# Patient Record
Sex: Female | Born: 1985 | Race: White | Hispanic: No | Marital: Single | Smoking: Former smoker
Health system: Southern US, Community
[De-identification: ages and names within clinical notes are randomized; demographics above are authoritative.]

## PROBLEM LIST (undated history)

## (undated) HISTORY — PX: HERNIA REPAIR: SHX51

---

## 2009-11-07 ENCOUNTER — Emergency Department (HOSPITAL_COMMUNITY): Admission: EM | Admit: 2009-11-07 | Discharge: 2009-11-07 | Payer: Self-pay | Admitting: Emergency Medicine

## 2011-01-31 ENCOUNTER — Emergency Department (HOSPITAL_COMMUNITY)
Admission: EM | Admit: 2011-01-31 | Discharge: 2011-02-01 | Disposition: A | Discharge status: Home / Self Care | Payer: Self-pay | Attending: Emergency Medicine | Admitting: Emergency Medicine

## 2011-01-31 DIAGNOSIS — IMO0002 Reserved for concepts with insufficient information to code with codable children: Secondary | ICD-10-CM | POA: Insufficient documentation

## 2011-01-31 DIAGNOSIS — T18108A Unspecified foreign body in esophagus causing other injury, initial encounter: Secondary | ICD-10-CM | POA: Insufficient documentation

## 2011-02-01 ENCOUNTER — Emergency Department (HOSPITAL_COMMUNITY): Payer: Self-pay

## 2012-05-18 ENCOUNTER — Emergency Department (HOSPITAL_COMMUNITY)
Admission: EM | Admit: 2012-05-18 | Discharge: 2012-05-18 | Disposition: A | Discharge status: Home / Self Care | Payer: Self-pay | Attending: Emergency Medicine | Admitting: Emergency Medicine

## 2012-05-18 ENCOUNTER — Emergency Department (HOSPITAL_COMMUNITY): Payer: Self-pay

## 2012-05-18 ENCOUNTER — Encounter (HOSPITAL_COMMUNITY): Payer: Self-pay | Admitting: *Deleted

## 2012-05-18 DIAGNOSIS — F17200 Nicotine dependence, unspecified, uncomplicated: Secondary | ICD-10-CM | POA: Insufficient documentation

## 2012-05-18 DIAGNOSIS — X500XXA Overexertion from strenuous movement or load, initial encounter: Secondary | ICD-10-CM | POA: Insufficient documentation

## 2012-05-18 DIAGNOSIS — Y998 Other external cause status: Secondary | ICD-10-CM | POA: Insufficient documentation

## 2012-05-18 DIAGNOSIS — Y929 Unspecified place or not applicable: Secondary | ICD-10-CM | POA: Insufficient documentation

## 2012-05-18 DIAGNOSIS — S335XXA Sprain of ligaments of lumbar spine, initial encounter: Secondary | ICD-10-CM | POA: Insufficient documentation

## 2012-05-18 DIAGNOSIS — Y939 Activity, unspecified: Secondary | ICD-10-CM | POA: Insufficient documentation

## 2012-05-18 DIAGNOSIS — S39012A Strain of muscle, fascia and tendon of lower back, initial encounter: Secondary | ICD-10-CM

## 2012-05-18 MED ORDER — HYDROMORPHONE HCL PF 2 MG/ML IJ SOLN
2.0000 mg | Freq: Once | INTRAMUSCULAR | Status: AC
  Administered 2012-05-18: 2 mg via INTRAMUSCULAR
  Filled 2012-05-18: qty 1

## 2012-05-18 MED ORDER — CYCLOBENZAPRINE HCL 10 MG PO TABS: 10.0000 mg | ORAL_TABLET | Freq: Two times a day (BID) | ORAL | Status: DC | PRN

## 2012-05-18 MED ORDER — HYDROCODONE-ACETAMINOPHEN 5-325 MG PO TABS: 2.0000 | ORAL_TABLET | ORAL | Status: DC | PRN

## 2012-05-18 MED ORDER — DICLOFENAC SODIUM 75 MG PO TBEC: 75.0000 mg | DELAYED_RELEASE_TABLET | Freq: Two times a day (BID) | ORAL | Status: DC

## 2012-05-18 MED ORDER — KETOROLAC TROMETHAMINE 60 MG/2ML IM SOLN
60.0000 mg | Freq: Once | INTRAMUSCULAR | Status: AC
  Administered 2012-05-18: 60 mg via INTRAMUSCULAR
  Filled 2012-05-18: qty 2

## 2012-05-18 NOTE — ED Notes (Signed)
Pt with lower back pain over the weekend since picking up a heavy box on Friday, today while at work, heard a pop sound while "adjusting herself on commode" , pt in tears in room

## 2012-05-18 NOTE — ED Provider Notes (Signed)
History     CSN: [id number]  Arrival date & time 05/18/12  1632   First MD Initiated Contact with Patient 05/18/12 1745      Chief Complaint  Patient presents with  . Back Pain    (Consider location/radiation/quality/duration/timing/severity/associated sxs/prior treatment) Patient is a 26 y.o. female presenting with back pain. The history is provided by the patient. No language interpreter was used.  Back Pain  This is a new problem. The current episode started more than 2 days ago. The problem occurs constantly. The problem has been rapidly worsening. The pain is associated with lifting heavy objects. The pain is present in the lumbar spine. The pain does not radiate. The pain is at a severity of 7/10. The pain is moderate. The pain is worse during the day. She has tried nothing for the symptoms.  Pt complains of pain in her low back.    History reviewed. No pertinent past medical history.  Past Surgical History  Procedure Date  . Hernia repair     History reviewed. No pertinent family history.  History  Substance Use Topics  . Smoking status: Current Every Day Smoker  . Smokeless tobacco: Not on file  . Alcohol Use:     OB History    Grav Para Term Preterm Abortions TAB SAB Ect Mult Living                  Review of Systems  Musculoskeletal: Positive for back pain.  All other systems reviewed and are negative.    Allergies  Other  Home Medications   Current Outpatient Rx  Name Route Sig Dispense Refill  . IBUPROFEN 200 MG PO TABS Oral Take 800 mg by mouth once as needed. For pain      BP 130/96  Pulse 109  Temp 97.6 F (36.4 C) (Oral)  Resp 22  Ht 5\' 4"  (1.626 m)  Wt 200 lb (90.719 kg)  BMI 34.33 kg/m2  SpO2 100%  LMP 05/11/2012  Physical Exam  Nursing note and vitals reviewed. Constitutional: She is oriented to person, place, and time. She appears well-developed and well-nourished.  HENT:  Head: Normocephalic and atraumatic.  Eyes: Pupils  are equal, round, and reactive to light.  Cardiovascular: Normal rate.   Pulmonary/Chest: Effort normal.  Abdominal: Soft.  Musculoskeletal: She exhibits tenderness.       Diffusely tender ls spine area  Pain with range of motion,  nv and ns intact  Neurological: She is alert and oriented to person, place, and time. She has normal reflexes.  Skin: Skin is warm.  Psychiatric: She has a normal mood and affect.    ED Course  Procedures (including critical care time)  Labs Reviewed - No data to display Dg Lumbar Spine Complete  05/18/2012  *RADIOLOGY REPORT*  Clinical Data: Back pain after after heavy lifting  LUMBAR SPINE - COMPLETE 4+ VIEW  Comparison: None.  Findings: Normal alignment of the lumbar vertebral bodies.  No loss of vertebral body height or disc height.  No subluxation.  No pars fracture.  IMPRESSION: No lumbar spine injury.   Original Report Authenticated By: Genevive Bi, M.D.      No diagnosis found.    MDM  Pt given torodol and dilaudid IM.    I will treat with flexeril and voltaren.   Pt advised to follow up with Dr. Romeo Apple for recheck        Elson Areas, [place] 05/18/12 [phone]

## 2012-05-18 NOTE — ED Notes (Addendum)
Low back pain for 2-3 days, worse today .  No known injury. Heard a pop when on commode today and  Became much worse

## 2012-05-19 NOTE — ED Provider Notes (Signed)
Medical screening examination/treatment/procedure(s) were performed by non-physician practitioner and as supervising physician I was immediately available for consultation/collaboration.  Jones Skene, M.D.     Jones Skene, MD 05/19/12 9211

## 2014-08-06 ENCOUNTER — Encounter (HOSPITAL_COMMUNITY): Payer: Self-pay | Admitting: Emergency Medicine

## 2014-08-06 ENCOUNTER — Emergency Department (HOSPITAL_COMMUNITY): Payer: Self-pay

## 2014-08-06 ENCOUNTER — Emergency Department (HOSPITAL_COMMUNITY)
Admission: EM | Admit: 2014-08-06 | Discharge: 2014-08-06 | Disposition: A | Discharge status: Home / Self Care | Payer: Self-pay | Attending: Emergency Medicine | Admitting: Emergency Medicine

## 2014-08-06 DIAGNOSIS — Z720 Tobacco use: Secondary | ICD-10-CM | POA: Insufficient documentation

## 2014-08-06 DIAGNOSIS — R059 Cough, unspecified: Secondary | ICD-10-CM

## 2014-08-06 DIAGNOSIS — J189 Pneumonia, unspecified organism: Secondary | ICD-10-CM

## 2014-08-06 DIAGNOSIS — R05 Cough: Secondary | ICD-10-CM

## 2014-08-06 DIAGNOSIS — Z791 Long term (current) use of non-steroidal anti-inflammatories (NSAID): Secondary | ICD-10-CM | POA: Insufficient documentation

## 2014-08-06 DIAGNOSIS — J159 Unspecified bacterial pneumonia: Secondary | ICD-10-CM | POA: Insufficient documentation

## 2014-08-06 MED ORDER — AZITHROMYCIN 250 MG PO TABS: 250.0000 mg | ORAL_TABLET | Freq: Every day | ORAL | Status: DC

## 2014-08-06 MED ORDER — BENZONATATE 100 MG PO CAPS: 100.0000 mg | ORAL_CAPSULE | Freq: Three times a day (TID) | ORAL | Status: DC

## 2014-08-06 NOTE — ED Provider Notes (Signed)
CSN: [id number]     Arrival date & time 08/06/14  0705 History  This chart was scribed for Hilario Quarryanielle S Tieasha Larsen, MD by Ronney LionSuzanne Le, ED Scribe. This patient was seen in room [facility]18[facility]18 and the patient's care was started at 8:22 AM.    Chief Complaint  Patient presents with  . Cough   The history is provided by the patient. No language interpreter was used.     HPI Comments: Catherine Acosta is a 29 y.o. female who presents to the Emergency Department complaining of a productive cough with yellow-white sputum that began 2 days ago. She complains of associated low-grade fever, up to 99.8. Patient has had sick contact as she has been caring for her mother, who has pneumonia. She is a 0.5 PPD smoker. She denies health problems. Patient denies loss of appetite. Dr. Prudy FeelerAngel Jones is her PCP. She denies pregnancy or known allergies to antibiotics.   History reviewed. No pertinent past medical history. Past Surgical History  Procedure Laterality Date  . Hernia repair     No family history on file. History  Substance Use Topics  . Smoking status: Current Every Day Smoker -- 0.50 packs/day    Types: Cigarettes  . Smokeless tobacco: Not on file  . Alcohol Use: No   OB History    No data available     Review of Systems  Constitutional: Positive for fever. Negative for appetite change.  Respiratory: Positive for cough.   All other systems reviewed and are negative.     Allergies  Other  Home Medications   Prior to Admission medications   Medication Sig Start Date End Date Taking? Authorizing Provider  cyclobenzaprine (FLEXERIL) 10 MG tablet Take 1 tablet (10 mg total) by mouth 2 (two) times daily as needed for muscle spasms. 05/18/12   Elson AreasLeslie K Sofia, PA-C  diclofenac (VOLTAREN) 75 MG EC tablet Take 1 tablet (75 mg total) by mouth 2 (two) times daily. 05/18/12   Elson AreasLeslie K Sofia, PA-C  HYDROcodone-acetaminophen (NORCO/VICODIN) 5-325 MG per tablet Take 2 tablets by mouth every 4 (four) hours  as needed for pain. 05/18/12   Elson AreasLeslie K Sofia, PA-C  ibuprofen (ADVIL,MOTRIN) 200 MG tablet Take 800 mg by mouth once as needed. For pain    Historical Provider, MD   BP 116/74 mmHg  Pulse 94  Temp(Src) 97.3 F (36.3 C) (Core (Comment))  Resp 18  Ht 5\' 5"  (1.651 m)  Wt 210 lb (95.255 kg)  BMI 34.95 kg/m2  SpO2 95%  LMP 07/26/2014 Physical Exam  Constitutional: She is oriented to person, place, and time. She appears well-developed and well-nourished. No distress.  HENT:  Head: Normocephalic and atraumatic.  Right Ear: External ear normal.  Left Ear: External ear normal.  Nose: Nose normal.  Eyes: Conjunctivae and EOM are normal. Pupils are equal, round, and reactive to light.  Neck: Normal range of motion. Neck supple.  Pulmonary/Chest: Effort normal.  Musculoskeletal: Normal range of motion.  Neurological: She is alert and oriented to person, place, and time. She exhibits normal muscle tone. Coordination normal.  Skin: Skin is warm and dry.  Psychiatric: She has a normal mood and affect. Her behavior is normal. Thought content normal.  Nursing note and vitals reviewed.   ED Course  Procedures (including critical care time)  DIAGNOSTIC STUDIES: Oxygen Saturation is 95% on room air, adequate by my interpretation.    COORDINATION OF CARE: 8:24 AM - Discussed treatment plan with pt at bedside which includes antibiotics  and pt agreed to plan.   Labs Review Labs Reviewed - No data to display  Imaging Review Dg Chest 2 View  08/06/2014   CLINICAL DATA:  Cough and fever for 2 days  EXAM: CHEST  2 VIEW  COMPARISON:  None.  FINDINGS: There is patchy infiltrate in the right middle lobe, better seen on the lateral view. Lungs elsewhere clear. Heart size and pulmonary vascularity are normal. No adenopathy. There is lower thoracic levoscoliosis.  IMPRESSION: Patchy infiltrate right middle lobe, better seen on lateral view.   Electronically Signed   By: Bretta Bang M.D.   On:  08/06/2014 08:10     EKG Interpretation None      MDM   Final diagnoses:  CAP (community acquired pneumonia)    I personally performed the services described in this documentation, which was scribed in my presence. The recorded information has been reviewed and considered.                   Hilario Quarry, MD 08/06/14 [phone]

## 2014-08-06 NOTE — ED Notes (Signed)
PT c/o nasal congestion, cough with productive yellow sputum and discomfort in her chest x2 days. PT reports mother at home with pneumonia recently. PT reports low grade fever at home and took tylenol prior to ED arrival.

## 2014-08-06 NOTE — Discharge Instructions (Signed)
Pneumonia, Adult  Pneumonia is an infection of the lungs. It may be caused by a germ (virus or bacteria). Some types of pneumonia can spread easily from person to person. This can happen when you cough or sneeze.  HOME CARE   Only take medicine as told by your doctor.   Take your medicine (antibiotics) as told. Finish it even if you start to feel better.   Do not smoke.   You may use a vaporizer or humidifier in your room. This can help loosen thick spit (mucus).   Sleep so you are almost sitting up (semi-upright). This helps reduce coughing.   Rest.  A shot (vaccine) can help prevent pneumonia. Shots are often advised for:   People over 65 years old.   Patients on chemotherapy.   People with long-term (chronic) lung problems.   People with immune system problems.  GET HELP RIGHT AWAY IF:    You are getting worse.   You cannot control your cough, and you are losing sleep.   You cough up blood.   Your pain gets worse, even with medicine.   You have a fever.   Any of your problems are getting worse, not better.   You have shortness of breath or chest pain.  MAKE SURE YOU:    Understand these instructions.   Will watch your condition.   Will get help right away if you are not doing well or get worse.  Document Released: 01/01/2008 Document Revised: 10/07/2011 Document Reviewed: 10/05/2010  ExitCare Patient Information 2015 [employer]. This information is not intended to replace advice given to you by your health care provider. Make sure you discuss any questions you have with your health care provider.

## 2015-05-09 ENCOUNTER — Emergency Department (HOSPITAL_COMMUNITY): Payer: Self-pay

## 2015-05-09 ENCOUNTER — Encounter (HOSPITAL_COMMUNITY): Payer: Self-pay | Admitting: Emergency Medicine

## 2015-05-09 ENCOUNTER — Emergency Department (HOSPITAL_COMMUNITY)
Admission: EM | Admit: 2015-05-09 | Discharge: 2015-05-09 | Disposition: A | Discharge status: Home / Self Care | Payer: Self-pay | Attending: Emergency Medicine | Admitting: Emergency Medicine

## 2015-05-09 DIAGNOSIS — S8991XA Unspecified injury of right lower leg, initial encounter: Secondary | ICD-10-CM | POA: Insufficient documentation

## 2015-05-09 DIAGNOSIS — W1849XA Other slipping, tripping and stumbling without falling, initial encounter: Secondary | ICD-10-CM | POA: Insufficient documentation

## 2015-05-09 DIAGNOSIS — Z720 Tobacco use: Secondary | ICD-10-CM | POA: Insufficient documentation

## 2015-05-09 DIAGNOSIS — Y998 Other external cause status: Secondary | ICD-10-CM | POA: Insufficient documentation

## 2015-05-09 DIAGNOSIS — Y9389 Activity, other specified: Secondary | ICD-10-CM | POA: Insufficient documentation

## 2015-05-09 DIAGNOSIS — Z792 Long term (current) use of antibiotics: Secondary | ICD-10-CM | POA: Insufficient documentation

## 2015-05-09 DIAGNOSIS — Z791 Long term (current) use of non-steroidal anti-inflammatories (NSAID): Secondary | ICD-10-CM | POA: Insufficient documentation

## 2015-05-09 DIAGNOSIS — M79604 Pain in right leg: Secondary | ICD-10-CM

## 2015-05-09 DIAGNOSIS — Y92007 Garden or yard of unspecified non-institutional (private) residence as the place of occurrence of the external cause: Secondary | ICD-10-CM | POA: Insufficient documentation

## 2015-05-09 NOTE — ED Notes (Signed)
Injured right lower leg hitting a stump in back yard.  Rates pain 6/10.

## 2015-05-09 NOTE — Discharge Instructions (Signed)

## 2015-05-09 NOTE — ED Provider Notes (Signed)
CSN: [id number]     Arrival date & time 05/09/15  1411 History   First MD Initiated Contact with Patient 05/09/15 1528     Chief Complaint  Patient presents with  . Leg Injury    right     (Consider location/radiation/quality/duration/timing/severity/associated sxs/prior Treatment) Patient is a 29 y.o. female presenting with leg pain. The history is provided by the patient. No language interpreter was used.  Leg Pain Location:  Leg Injury: no   Leg location:  R leg Pain details:    Quality:  Aching   Radiates to:  Does not radiate   Severity:  Moderate   Timing:  Constant   Progression:  Worsening Foreign body present:  No foreign bodies Relieved by:  Nothing Worsened by:  Nothing tried Associated symptoms: no decreased ROM   Pt tripped over a stump,  She did not fall.  Pt reports side of lower leg is sore today.  History reviewed. No pertinent past medical history. Past Surgical History  Procedure Laterality Date  . Hernia repair     History reviewed. No pertinent family history. Social History  Substance Use Topics  . Smoking status: Current Every Day Smoker -- 0.50 packs/day    Types: Cigarettes  . Smokeless tobacco: None  . Alcohol Use: No   OB History    No data available     Review of Systems  Musculoskeletal: Positive for myalgias.  All other systems reviewed and are negative.     Allergies  Other  Home Medications   Prior to Admission medications   Medication Sig Start Date End Date Taking? Authorizing Provider  azithromycin (ZITHROMAX Z-PAK) 250 MG tablet Take 1 tablet (250 mg total) by mouth daily. Take two on day one, then one on day 2-5 08/06/14   Margarita Grizzle, MD  benzonatate (TESSALON) 100 MG capsule Take 1 capsule (100 mg total) by mouth every 8 (eight) hours. 08/06/14   Margarita Grizzle, MD  cyclobenzaprine (FLEXERIL) 10 MG tablet Take 1 tablet (10 mg total) by mouth 2 (two) times daily as needed for muscle spasms. 05/18/12   Elson Areas, PA-C   diclofenac (VOLTAREN) 75 MG EC tablet Take 1 tablet (75 mg total) by mouth 2 (two) times daily. 05/18/12   Elson Areas, PA-C  HYDROcodone-acetaminophen (NORCO/VICODIN) 5-325 MG per tablet Take 2 tablets by mouth every 4 (four) hours as needed for pain. 05/18/12   Elson Areas, PA-C  ibuprofen (ADVIL,MOTRIN) 200 MG tablet Take 800 mg by mouth once as needed. For pain    Historical Provider, MD   BP 132/80 mmHg  Pulse 92  Temp(Src) 98 F (36.7 C) (Oral)  Resp 16  Ht  (1.676 m)  Wt 215 lb (97.523 kg)  BMI 34.72 kg/m2  SpO2 99%  LMP 04/25/2015 Physical Exam  Constitutional: She appears well-developed and well-nourished.  Musculoskeletal: She exhibits tenderness.  Tender right lower leg, no bruising, no deformity  nv and ns intact  Skin: Skin is warm.  Psychiatric: She has a normal mood and affect.  Nursing note and vitals reviewed.   ED Course  Procedures (including critical care time) Labs Review Labs Reviewed - No data to display  Imaging Review Dg Tibia/fibula Right  05/09/2015   CLINICAL DATA:  Fall last night.  Bruising and swelling  EXAM: RIGHT TIBIA AND FIBULA - 2 VIEW  COMPARISON:  None.  FINDINGS: There is no evidence of fracture or other focal bone lesions. Soft tissues are unremarkable.  IMPRESSION:  Negative.   Electronically Signed   By: Marlan Palau M.D.   On: 05/09/2015 14:50   I have personally reviewed and evaluated these images and lab results as part of my medical decision-making.   EKG Interpretation None      MDM  Soreness seems to be in muscle,  I suspect pulled/strained muscle.  Pt advised ice, elevate and ibuprofen   Final diagnoses:  Leg pain, right        Elson Areas, PA-C 05/09/15 1629  Donnetta Hutching, MD 05/11/15 0730

## 2016-07-22 ENCOUNTER — Encounter (HOSPITAL_COMMUNITY): Payer: Self-pay | Admitting: Emergency Medicine

## 2016-07-22 ENCOUNTER — Emergency Department (HOSPITAL_COMMUNITY): Payer: Self-pay

## 2016-07-22 ENCOUNTER — Inpatient Hospital Stay (HOSPITAL_COMMUNITY)
Admission: EM | Admit: 2016-07-22 | Discharge: 2016-07-23 | DRG: 158 | Discharge status: Against Medical Advice | Payer: Self-pay | Attending: Internal Medicine | Admitting: Internal Medicine

## 2016-07-22 DIAGNOSIS — R112 Nausea with vomiting, unspecified: Secondary | ICD-10-CM | POA: Diagnosis present

## 2016-07-22 DIAGNOSIS — L03211 Cellulitis of face: Secondary | ICD-10-CM | POA: Diagnosis present

## 2016-07-22 DIAGNOSIS — Z87891 Personal history of nicotine dependence: Secondary | ICD-10-CM

## 2016-07-22 DIAGNOSIS — Z881 Allergy status to other antibiotic agents status: Secondary | ICD-10-CM

## 2016-07-22 DIAGNOSIS — Z803 Family history of malignant neoplasm of breast: Secondary | ICD-10-CM

## 2016-07-22 DIAGNOSIS — K047 Periapical abscess without sinus: Principal | ICD-10-CM | POA: Diagnosis present

## 2016-07-22 LAB — CBC WITH DIFFERENTIAL/PLATELET
Basophils Absolute: 0 10*3/uL (ref 0.0–0.1)
Basophils Relative: 0 %
EOS ABS: 0.1 10*3/uL (ref 0.0–0.7)
EOS PCT: 1 %
HCT: 39.2 % (ref 36.0–46.0)
Hemoglobin: 13.6 g/dL (ref 12.0–15.0)
LYMPHS ABS: 3.3 10*3/uL (ref 0.7–4.0)
LYMPHS PCT: 29 %
MCH: 31.4 pg (ref 26.0–34.0)
MCHC: 34.7 g/dL (ref 30.0–36.0)
MCV: 90.5 fL (ref 78.0–100.0)
MONO ABS: 0.9 10*3/uL (ref 0.1–1.0)
MONOS PCT: 7 %
Neutro Abs: 7.3 10*3/uL (ref 1.7–7.7)
Neutrophils Relative %: 63 %
PLATELETS: 319 10*3/uL (ref 150–400)
RBC: 4.33 MIL/uL (ref 3.87–5.11)
RDW: 12.1 % (ref 11.5–15.5)
WBC: 11.6 10*3/uL — ABNORMAL HIGH (ref 4.0–10.5)

## 2016-07-22 LAB — BASIC METABOLIC PANEL
Anion gap: 6 (ref 5–15)
BUN: 9 mg/dL (ref 6–20)
CO2: 25 mmol/L (ref 22–32)
CREATININE: 0.6 mg/dL (ref 0.44–1.00)
Calcium: 9.3 mg/dL (ref 8.9–10.3)
Chloride: 105 mmol/L (ref 101–111)
GFR calc Af Amer: >60 mL/min (ref >60)
GLUCOSE: 112 mg/dL — AB (ref 65–99)
POTASSIUM: 3.7 mmol/L (ref 3.5–5.1)
SODIUM: 136 mmol/L (ref 135–145)

## 2016-07-22 MED ORDER — SODIUM CHLORIDE 0.9 % IV SOLN
1000.0000 mL | Freq: Once | INTRAVENOUS | Status: AC
  Administered 2016-07-22: 1000 mL via INTRAVENOUS

## 2016-07-22 MED ORDER — SODIUM CHLORIDE 0.9 % IV SOLN
1000.0000 mL | INTRAVENOUS | Status: DC
  Administered 2016-07-22: 1000 mL via INTRAVENOUS

## 2016-07-22 MED ORDER — ONDANSETRON HCL 4 MG/2ML IJ SOLN
4.0000 mg | Freq: Once | INTRAMUSCULAR | Status: AC
  Administered 2016-07-22: 4 mg via INTRAVENOUS
  Filled 2016-07-22: qty 2

## 2016-07-22 MED ORDER — CLINDAMYCIN PHOSPHATE 900 MG/50ML IV SOLN
900.0000 mg | Freq: Once | INTRAVENOUS | Status: AC
  Administered 2016-07-22: 900 mg via INTRAVENOUS
  Filled 2016-07-22: qty 50

## 2016-07-22 MED ORDER — KETOROLAC TROMETHAMINE 30 MG/ML IJ SOLN
30.0000 mg | Freq: Once | INTRAMUSCULAR | Status: AC
  Administered 2016-07-22: 30 mg via INTRAVENOUS
  Filled 2016-07-22: qty 1

## 2016-07-22 MED ORDER — IOPAMIDOL (ISOVUE-300) INJECTION 61%
75.0000 mL | Freq: Once | INTRAVENOUS | Status: AC | PRN
  Administered 2016-07-22: 75 mL via INTRAVENOUS

## 2016-07-22 MED ORDER — HYDROMORPHONE HCL 1 MG/ML IJ SOLN
1.0000 mg | Freq: Once | INTRAMUSCULAR | Status: AC
  Administered 2016-07-22: 1 mg via INTRAVENOUS
  Filled 2016-07-22: qty 1

## 2016-07-22 NOTE — ED Provider Notes (Signed)
[facility]-EMERGENCY DEPT Provider Note   CSN: [id number] Arrival date & time: 07/22/16  2145   By signing my name below, I, Bobbie Stackhristopher Reid, attest that this documentation has been prepared under the direction and in the presence of Azalia BilisKevin Darian Cansler, MD. Electronically Signed: Bobbie Stackhristopher Reid, Scribe. 07/22/16. 10:30 PM. History   Chief Complaint Chief Complaint  Patient presents with  . Facial Swelling     The history is provided by the patient and a friend. No language interpreter was used.    HPI Comments: Catherine Acosta is a 30 y.o. female who presents to the Emergency Department complaining of right sided facial swelling since yesterday. Per triage note: Patient has had a right dental abscess for 2 days. The patient reports that the swelling didn't begin until yesterday. She states that [facility] in which they gave her Deoxycycline. She states that this antibiotic caused her to vomit. Patient reports no alleviating factors.  History reviewed. No pertinent past medical history.  There are no active problems to display for this patient.   Past Surgical History:  Procedure Laterality Date  . HERNIA REPAIR      OB History    Gravida Para Term Preterm AB Living             0   SAB TAB Ectopic Multiple Live Births                   Home Medications    Prior to Admission medications   Medication Sig Start Date End Date Taking? Authorizing Provider  doxycycline (ADOXA) 100 MG tablet Take 100 mg by mouth 2 (two) times daily. 10 day course starting on 07/21/2016 07/21/16  Yes Historical Provider, MD  Echinacea 125 MG CAPS Take 1 capsule by mouth 2 (two) times daily.   Yes Historical Provider, MD  traMADol (ULTRAM) 50 MG tablet Take 50 mg by mouth every 4 (four) hours as needed. For pain 07/21/16  Yes Historical Provider, MD  vitamin C (ASCORBIC ACID) 500 MG tablet Take 1,000 mg by mouth daily.   Yes Historical Provider, MD    Family  History History reviewed. No pertinent family history.  Social History Social History  Substance Use Topics  . Smoking status: Former Smoker    Packs/day: 0.50    Types: Cigarettes  . Smokeless tobacco: Never Used  . Alcohol use No     Allergies   Keflex [cephalexin] and Other   Review of Systems Review of Systems  All other systems reviewed and are negative.  A complete 10 system review of systems was obtained and all systems are negative except as noted in the HPI and PMH.   Physical Exam Updated Vital Signs BP 135/98 (BP Location: Left Arm)   Pulse 73   Temp 97.8 F (36.6 C) (Temporal)   Resp 20   Ht 5\' 4"  (1.626 m)   Wt 210 lb (95.3 kg)   LMP 07/10/2016   SpO2 100%   BMI 36.05 kg/m   Physical Exam  Constitutional: She is oriented to person, place, and time. She appears well-developed and well-nourished.  HENT:  Significant right sided facial swelling. Tenderness of right lower first molar without gingival swelling or fluctuance. Erythema of submental space without significant swelling. No anterior neck erythema or chest erythema. Tolerating secretions. Mild trismus.   Eyes: EOM are normal.  Neck: Normal range of motion.  Pulmonary/Chest: Effort normal.  Abdominal: She exhibits no distension.  Musculoskeletal: Normal range  of motion.  Neurological: She is alert and oriented to person, place, and time.  Psychiatric: She has a normal mood and affect.  Nursing note and vitals reviewed.    ED Treatments / Results  DIAGNOSTIC STUDIES: Oxygen Saturation is 100% on RA, normal by my interpretation.    COORDINATION OF CARE: 10:30 PM Discussed treatment plan with pt at bedside and pt agreed to plan.  Labs (all labs ordered are listed, but only abnormal results are displayed) Labs Reviewed - No data to display  EKG  EKG Interpretation None       Radiology No results found.  Procedures Procedures (including critical care time)  Medications Ordered  in ED Medications - No data to display   Initial Impression / Assessment and Plan / ED Course  I have reviewed the triage vital signs and the nursing notes.  Pertinent labs & imaging results that were available during my care of the patient were reviewed by me and considered in my medical decision making (see chart for details).  Clinical Course     Patient started on IV clindamycin.  Pain treated.  Significant swelling of right side of her face.  CT imaging pending for likely odontogenic source of infection.  11:16 PM Care to Dr Lynelle DoctorKnapp  Final Clinical Impressions(s) / ED Diagnoses   Final diagnoses:  None    New Prescriptions New Prescriptions   No medications on file   I personally performed the services described in this documentation, which was scribed in my presence. The recorded information has been reviewed and is accurate.       Azalia BilisKevin Anastashia Westerfeld, MD 07/22/16 2322

## 2016-07-22 NOTE — ED Triage Notes (Signed)
Pt reports R dental abscess that started 2 days ago. [facility] and given Doxycycline and Tramadol. Pt states the doxycycline is making her vomit and she has developed R sided facial edema.

## 2016-07-23 ENCOUNTER — Encounter (HOSPITAL_COMMUNITY): Payer: Self-pay | Admitting: Internal Medicine

## 2016-07-23 DIAGNOSIS — L03211 Cellulitis of face: Secondary | ICD-10-CM | POA: Diagnosis present

## 2016-07-23 DIAGNOSIS — R112 Nausea with vomiting, unspecified: Secondary | ICD-10-CM | POA: Diagnosis present

## 2016-07-23 LAB — HEPATIC FUNCTION PANEL
ALK PHOS: 39 U/L (ref 38–126)
ALT: 17 U/L (ref 14–54)
AST: 19 U/L (ref 15–41)
Albumin: 4 g/dL (ref 3.5–5.0)
Total Bilirubin: 0.2 mg/dL — ABNORMAL LOW (ref 0.3–1.2)
Total Protein: 7 g/dL (ref 6.5–8.1)

## 2016-07-23 LAB — MRSA PCR SCREENING: MRSA by PCR: NEGATIVE

## 2016-07-23 MED ORDER — CLINDAMYCIN PHOSPHATE 900 MG/50ML IV SOLN
INTRAVENOUS | Status: AC
  Filled 2016-07-23: qty 50

## 2016-07-23 MED ORDER — POTASSIUM CHLORIDE IN NACL 20-0.9 MEQ/L-% IV SOLN
INTRAVENOUS | Status: DC
  Administered 2016-07-23: 02:00:00 via INTRAVENOUS

## 2016-07-23 MED ORDER — HYDROMORPHONE HCL 1 MG/ML IJ SOLN: 1.0000 mg | INTRAMUSCULAR | Status: DC | PRN

## 2016-07-23 MED ORDER — PANTOPRAZOLE SODIUM 40 MG PO TBEC
40.0000 mg | DELAYED_RELEASE_TABLET | Freq: Every day | ORAL | Status: DC
  Administered 2016-07-23: 40 mg via ORAL
  Filled 2016-07-23 (×2): qty 1

## 2016-07-23 MED ORDER — ONDANSETRON HCL 4 MG/2ML IJ SOLN: 4.0000 mg | Freq: Four times a day (QID) | INTRAMUSCULAR | Status: DC | PRN

## 2016-07-23 MED ORDER — CLINDAMYCIN PHOSPHATE 900 MG/50ML IV SOLN
900.0000 mg | Freq: Three times a day (TID) | INTRAVENOUS | Status: DC
  Administered 2016-07-23 (×3): 900 mg via INTRAVENOUS
  Filled 2016-07-23 (×6): qty 50

## 2016-07-23 MED ORDER — LORAZEPAM 0.5 MG PO TABS: 0.5000 mg | ORAL_TABLET | Freq: Once | ORAL | Status: DC | PRN

## 2016-07-23 MED ORDER — ENOXAPARIN SODIUM 40 MG/0.4ML ~~LOC~~ SOLN
40.0000 mg | SUBCUTANEOUS | Status: DC
  Filled 2016-07-23: qty 0.4

## 2016-07-23 MED ORDER — HYDROMORPHONE HCL 1 MG/ML IJ SOLN
1.0000 mg | INTRAMUSCULAR | Status: AC | PRN
  Administered 2016-07-23 (×2): 1 mg via INTRAVENOUS
  Filled 2016-07-23 (×2): qty 1

## 2016-07-23 MED ORDER — HYDROCODONE-ACETAMINOPHEN 5-325 MG PO TABS
1.0000 | ORAL_TABLET | Freq: Four times a day (QID) | ORAL | Status: DC | PRN
  Administered 2016-07-23 (×3): 2 via ORAL
  Filled 2016-07-23 (×3): qty 2

## 2016-07-23 MED ORDER — KETOROLAC TROMETHAMINE 30 MG/ML IJ SOLN
30.0000 mg | Freq: Four times a day (QID) | INTRAMUSCULAR | Status: DC
  Administered 2016-07-23 (×3): 30 mg via INTRAVENOUS
  Filled 2016-07-23 (×3): qty 1

## 2016-07-23 NOTE — Progress Notes (Signed)
RN, Misty StanleyLisa, paged this NP for pain med for pt and to inform NP that pt was talking about leaving hospital to go to another tx facility.  NP spoke with Misty StanleyLisa.  Per Misty StanleyLisa, RN, pt is upset about her face and the fact that the doctor only spent a couple of minutes with her today and she felt like no one cared.   NP reordered some Dilaudid IV. Pt has Hydrocodone and Toradol ordered as well. NP ordered Ativan once prn for rest/nerves and a kpad for face.  NP asked Misty StanleyLisa, RN, to call NP back so NP could speak with pt over the phone.  Later, Misty StanleyLisa, RN, called NP to state pt had already left hospital, refused to speak with NP on the telephone, and refused to sign AMA papers. Misty StanleyLisa stated she spent a lot of time talking with pt tonight but pt wouldn't stay.  KJKG, NP [facility]

## 2016-07-23 NOTE — Progress Notes (Signed)
Patient was admitted earlier this morning by Dr. Robb Matarrtiz.  Patient seen and examined. She continues to complain of pain in her right cheek and has difficulty eating food on that side. No draining lesions. She is swollen and tender to touch on right cheek. She has been started on clindamycin. She does not have any drainable fluid collections on CT imaging. Will need to continue with IV antibiotics for now.  MEMON,JEHANZEB

## 2016-07-23 NOTE — ED Provider Notes (Signed)
Pt left at change of shift to get results of CT soft tissue neck. [facility] and started on doxycycline but she vomited it within minutes of taking it.    Pt noted to have large swelling of her right face, her right anterior neck with redness and warmth.      Ct Soft Tissue Neck W Contrast  Result Date: 07/23/2016 CLINICAL DATA:  Right facial swelling EXAM: CT NECK WITH CONTRAST TECHNIQUE: Multidetector CT imaging of the neck was performed using the standard protocol following the bolus administration of intravenous contrast. CONTRAST:  75mL ISOVUE-300 IOPAMIDOL (ISOVUE-300) INJECTION 61% COMPARISON:  None. FINDINGS: Pharynx and larynx: The nasopharynx is clear. The oropharynx and hypopharynx are normal. The epiglottis, supraglottic larynx, glottis and subglottic larynx are normal. No retropharyngeal collection. The parapharyngeal spaces are preserved. Salivary glands: The parotid and submandibular glands are normal. No sialolithiasis or salivary ductal dilatation. Thyroid: Normal Lymph nodes: Enlarged right level 1 B lymph nodes measure up to 7 mm. Vascular: The major cervical vessels are normal. Limited intracranial: Normal Visualized orbits: Normal Mastoids and visualized paranasal sinuses: Clear Skeleton: Normal Upper chest: Clear Other: There is severe soft tissue swelling of the right face overlying the right mandibular body. There is inflammatory stranding within the subcutaneous fat of the right face extending inferiorly below the mandible. There is no discrete fluid collection. There is no clear source identified. IMPRESSION: 1. Soft tissue swelling and inflammation of the right face overlying the right mandibular body without a clear dental source. No drainable fluid collection. 2. Reactive right level 1 lymphadenopathy. Electronically Signed   By: Deatra RobinsonKevin  Herman M.D.   On: 07/23/2016 00:23   12:43 AM Dr Robb Matarrtiz will admit and do orders.  12:45 AM pt informed  of her CT results and that she would be admitted here.  Diagnoses that have been ruled out:  None  Diagnoses that are still under consideration:  None  Final diagnoses:  Facial cellulitis   Plan admission  Devoria AlbeIva Tramar Brueckner, MD, Concha PyoFACEP       Carmyn Hamm, MD 07/23/16 [phone]

## 2016-07-23 NOTE — H&P (Signed)
History and Physical    HAYDON KALMAR KGM:[id number] DOB: 1986-03-14 DOA: 07/22/2016  PCP: Remus Loffler, PA-C   Patient coming from: Home.  Chief Complaint: Dental abscess.  HPI: Catherine Acosta is a 30 y.o. female without any significant medical history who is coming to the emergency department for evaluation of a right "dental abscess".   Per patient, she started having pain about 2 days ago. Yesterday evening, and she went to [facility] and was given doxycycline and tramadol. However, when she tried taking the doxycycline on 2 different times, she states that he made her vomit. She comes tonight to the hospital due to development of marked swelling of her right facial area today and worsening of pain. She denies trauma to the area, fever, chills, night sweats, but feels a little fatigued. Initially, she rated her facial pain at around 10, but states that after the medications were given the pain has decreased by at least 50%. She denies headache, dysphagia, dyspnea, cough, chest pain, palpitations, dizziness, diaphoresis or lower extremity edema. She denies abdominal pain or nausea at this time. She denies diarrhea, constipation, melena or hematochezia. She denies GU symptoms.  ED Course: The patient received a 1 L normal saline bolus, hydromorphone 1 mg, ketorolac 30 mg, Zofran 4 mg, and clindamycin 900 mg IVPB. Her workup shows WBC of 11.6, hemoglobin 13.6, platelets 319. Her sodium was 136, potassium 3.7, chloride 105 and CO2 was 25 mmol/L. Her BUN was 9, creatinine 0.6 and glucose 112 mg/dL.  Imaging: CT of the neck with contrast shows soft tissue swelling of the right face overlying the right mandibular body with reactive adenopathy.  Review of Systems: As per HPI otherwise 10 point review of systems negative.    History reviewed. No pertinent past medical history.  Past Surgical History:  Procedure Laterality Date  . HERNIA REPAIR       reports that she has  quit smoking. Her smoking use included Cigarettes. She smoked 0.50 packs per day. She has never used smokeless tobacco. She reports that she does not drink alcohol or use drugs.  Allergies  Allergen Reactions  . Keflex [Cephalexin] Other (See Comments)    Reaction to skin "almost like a burn"  . Other Swelling    Fish oil pills: facial and body swelling, redness   Family history  Paternal grandmother- Breast cancer   Prior to Admission medications   Medication Sig Start Date End Date Taking? Authorizing Provider  doxycycline (ADOXA) 100 MG tablet Take 100 mg by mouth 2 (two) times daily. 10 day course starting on 07/21/2016 07/21/16  Yes Historical Provider, MD  Echinacea 125 MG CAPS Take 1 capsule by mouth 2 (two) times daily.   Yes Historical Provider, MD  traMADol (ULTRAM) 50 MG tablet Take 50 mg by mouth every 4 (four) hours as needed. For pain 07/21/16  Yes Historical Provider, MD  vitamin C (ASCORBIC ACID) 500 MG tablet Take 1,000 mg by mouth daily.   Yes Historical Provider, MD    Physical Exam:  Constitutional: NAD, calm, comfortable Vitals:   07/22/16 2150  BP: 135/98  Pulse: 73  Resp: 20  Temp: 97.8 F (36.6 C)  TempSrc: Temporal  SpO2: 100%  Weight: 95.3 kg (210 lb)  Height: 5\' 4"  (1.626 m)   Eyes: PERRL, lids and conjunctivae normal ENMT: Mucous membranes are moist. Posterior pharynx clear of any exudate or lesions. Right sided lower molars with metal fillings. Neck: normal, supple, no masses, no thyromegaly Respiratory:  clear to auscultation bilaterally, no wheezing, no crackles. Normal respiratory effort. No accessory muscle use.  Cardiovascular: Regular rate and rhythm, no murmurs / rubs / gallops. No extremity edema. 2+ pedal pulses. No carotid bruits.  Abdomen: no tenderness, no masses palpated. No hepatosplenomegaly. Bowel sounds positive.  Musculoskeletal: no clubbing / cyanosis. No joint deformity upper and lower extremities. Good ROM, no contractures.  Normal muscle tone.  Skin: Positive edema, erythema, calor and tenderness of the right side of face area. Neurologic: CN 2-12 grossly intact. Sensation intact, DTR normal. Strength 5/5 in all 4.  Psychiatric: Normal judgment and insight. Alert and oriented x 3. Normal mood.    Labs on Admission: I have personally reviewed following labs and imaging studies  CBC:  Recent Labs Lab 07/22/16 2255  WBC 11.6*  NEUTROABS 7.3  HGB 13.6  HCT 39.2  MCV 90.5  PLT 319   Basic Metabolic Panel:  Recent Labs Lab 07/22/16 2255  NA 136  K 3.7  CL 105  CO2 25  GLUCOSE 112*  BUN 9  CREATININE 0.60  CALCIUM 9.3   GFR: Estimated Creatinine Clearance: 115.1 mL/min (by C-G formula based on SCr of 0.6 mg/dL). Liver Function Tests: No results for input(s): AST, ALT, ALKPHOS, BILITOT, PROT, ALBUMIN in the last 168 hours. No results for input(s): LIPASE, AMYLASE in the last 168 hours. No results for input(s): AMMONIA in the last 168 hours. Coagulation Profile: No results for input(s): INR, PROTIME in the last 168 hours. Cardiac Enzymes: No results for input(s): CKTOTAL, CKMB, CKMBINDEX, TROPONINI in the last 168 hours. BNP (last 3 results) No results for input(s): PROBNP in the last 8760 hours. HbA1C: No results for input(s): HGBA1C in the last 72 hours. CBG: No results for input(s): GLUCAP in the last 168 hours. Lipid Profile: No results for input(s): CHOL, HDL, LDLCALC, TRIG, CHOLHDL, LDLDIRECT in the last 72 hours. Thyroid Function Tests: No results for input(s): TSH, T4TOTAL, FREET4, T3FREE, THYROIDAB in the last 72 hours. Anemia Panel: No results for input(s): VITAMINB12, FOLATE, FERRITIN, TIBC, IRON, RETICCTPCT in the last 72 hours. Urine analysis: No results found for: COLORURINE, APPEARANCEUR, LABSPEC, PHURINE, GLUCOSEU, HGBUR, BILIRUBINUR, KETONESUR, PROTEINUR, UROBILINOGEN, NITRITE, LEUKOCYTESUR  Radiological Exams on Admission: Ct Soft Tissue Neck W Contrast  Result  Date: 07/23/2016 CLINICAL DATA:  Right facial swelling EXAM: CT NECK WITH CONTRAST TECHNIQUE: Multidetector CT imaging of the neck was performed using the standard protocol following the bolus administration of intravenous contrast. CONTRAST:  75mL ISOVUE-300 IOPAMIDOL (ISOVUE-300) INJECTION 61% COMPARISON:  None. FINDINGS: Pharynx and larynx: The nasopharynx is clear. The oropharynx and hypopharynx are normal. The epiglottis, supraglottic larynx, glottis and subglottic larynx are normal. No retropharyngeal collection. The parapharyngeal spaces are preserved. Salivary glands: The parotid and submandibular glands are normal. No sialolithiasis or salivary ductal dilatation. Thyroid: Normal Lymph nodes: Enlarged right level 1 B lymph nodes measure up to 7 mm. Vascular: The major cervical vessels are normal. Limited intracranial: Normal Visualized orbits: Normal Mastoids and visualized paranasal sinuses: Clear Skeleton: Normal Upper chest: Clear Other: There is severe soft tissue swelling of the right face overlying the right mandibular body. There is inflammatory stranding within the subcutaneous fat of the right face extending inferiorly below the mandible. There is no discrete fluid collection. There is no clear source identified. IMPRESSION: 1. Soft tissue swelling and inflammation of the right face overlying the right mandibular body without a clear dental source. No drainable fluid collection. 2. Reactive right level 1 lymphadenopathy. Electronically Signed  By: Deatra RobinsonKevin  Herman M.D.   On: 07/23/2016 00:23     Assessment/Plan Principal Problem:   Facial cellulitis Admit to MedSurg his last inpatient. Mechanical soft diet. Continue clindamycin 900 mg IVPB every 6 hours. Hydromorphone 1 mg IVP every 3 hours 3 doses. Switch to oral analgesics once the patient edema decreases. Toradol 30 mg IVP every 6 hours 4 doses. Consult ENT if needed.  Active Problems:   Nausea and vomiting Normal saline plus  KCl 20 Meq at 125 mL an hour. Continue Zofran 4 mg IVP every 6 hours when necessary. Pantoprazole 40 mg by mouth daily.    DVT prophylaxis: Lovenox SQ.  Code Status: Full code.  Family Communication:  Disposition Plan: Admit for IV antibiotic therapy and pain control.  Consults called:  Admission status: Inpatient/MedSurg.   Bobette Moavid Manuel Chrissy Ealey MD [facility] Pager [phone].  If 7PM-7AM, please contact night-coverage [email or web address] Password [email or web address]  07/23/2016, 1:08 AM

## 2016-07-23 NOTE — ED Notes (Signed)
[facility], all questions answered

## 2016-07-23 NOTE — Care Management Note (Signed)
Case Management Note  Patient Details  Name: Catherine Acosta MRN: [id number] Date of Birth: 02-Feb-1986  Subjective/Objective:     Patient adm from home with dental abscess/facial cellulitis. She has failed OP treatment, reports vomiting antibiotic. She may need MATCH voucher at discharge. [facility] and has [facility].          Action/Plan: Will follow for CM needs.   Expected Discharge Date:           07/24/2016       Expected Discharge Plan:  Home/Self Care  In-House Referral:     Discharge planning Services  CM Consult  Post Acute Care Choice:    Choice offered to:     DME Arranged:    DME Agency:     HH Arranged:    HH Agency:     Status of Service:  In process, will continue to follow  If discussed at Long Length of Stay Meetings, dates discussed:    Additional Comments:  Tasha Jindra, Chrystine OilerSharley Diane, RN 07/23/2016, 11:44 AM

## 2016-07-24 NOTE — Progress Notes (Signed)
Patient is a 30 year old female.  Very upset and crying because of facial pain and swelling.  Very upset that MD had not been in room today to discuss results of lab work or CT scan.  Patient and her mother and other family members demanding to know what was wrong with patient and when would they see Md.  Patient wanted something stronger for pain, because pain was becoming worse.  I spent a lot of time with patient assuring her that antibiotics would take care of her pain and swelling.  Called on call NP and received orders for pain medicine, and anxiety meds.  Tried both ice packs and heat packs to face to help bring some relief.  Patient and her family determined to leave this facility to go to another facility to see another doctor.  NP offered to talk to patient over the phone and patient refused.  Insisted on seeing a doctor.  Patient requested to have IV taken out and refused to sign AMA papers.  Patient left the floor around 950PM with her mother and other family members.

## 2016-12-12 ENCOUNTER — Ambulatory Visit: Payer: Self-pay | Admitting: Physician Assistant

## 2016-12-17 ENCOUNTER — Encounter: Payer: Self-pay | Admitting: Physician Assistant

## 2018-06-04 IMAGING — CT CT NECK W/ CM
3 of 4 series · 14 of 33 positions shown, 17 images · IV contrast (iopamidol)
Comparison: None.

CLINICAL DATA: Right facial swelling

EXAM:
CT NECK WITH CONTRAST
TECHNIQUE: Multidetector CT imaging of the neck was performed using the
standard protocol following the bolus administration of intravenous
contrast.
CONTRAST:  75mL 06HXDH-SCC IOPAMIDOL (06HXDH-SCC) INJECTION 61%

[Series 4: sag neck · sagittal · 0.46mm/px · 5 of 101 slices shown, 6 images]
[im 34/101  bone]
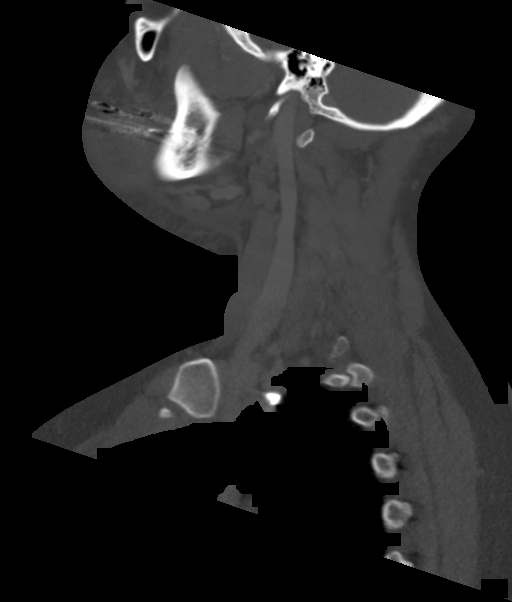
[im 42/101  bone]
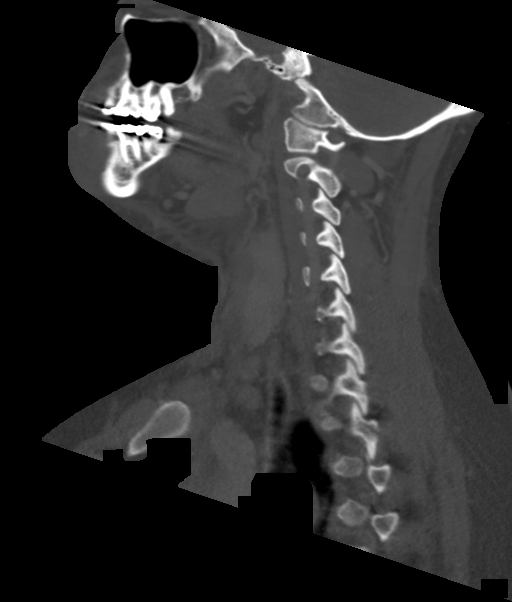
[im 51/101  soft-tissue]
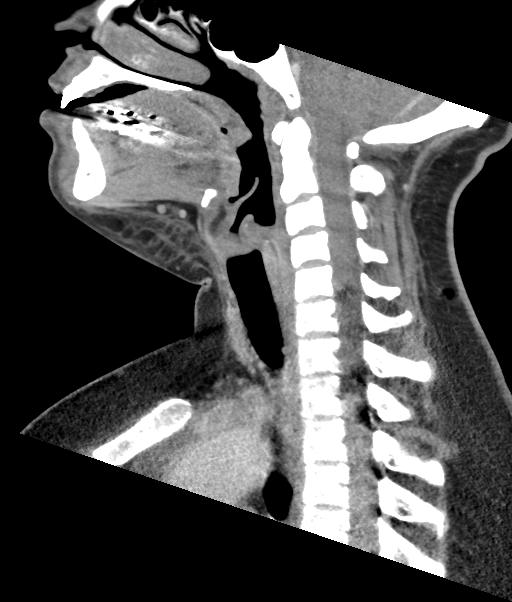
[im 51/101  bone]
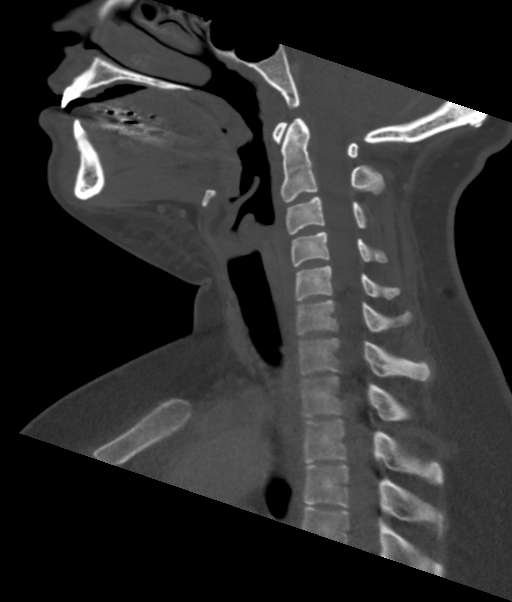
[im 59/101  bone]
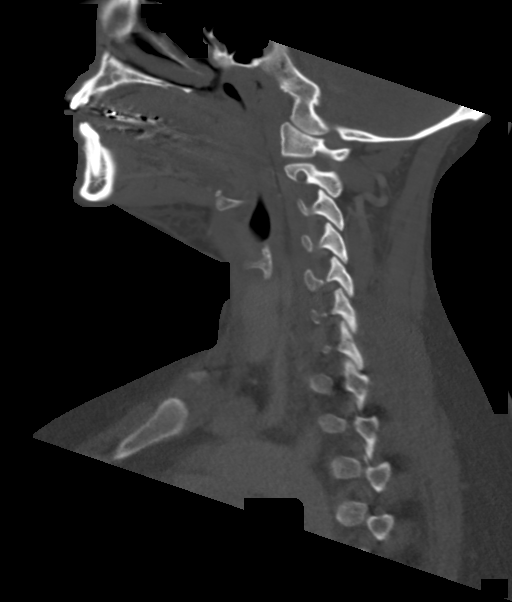
[im 67/101  bone]
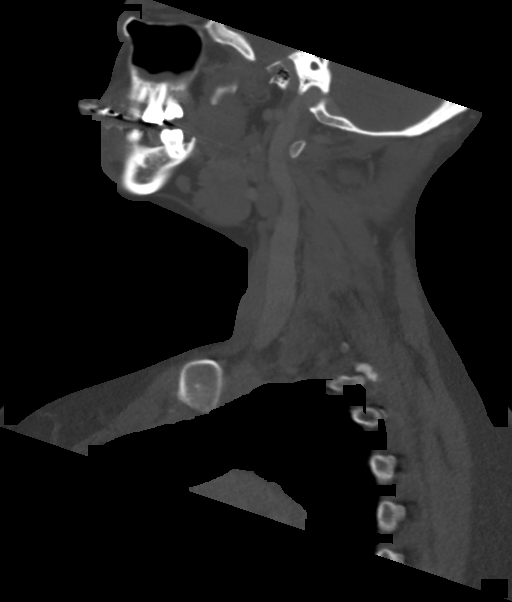

[Series 5: cor neck · coronal · 0.45mm/px · 3 of 101 slices shown]
[im 31/101  bone]
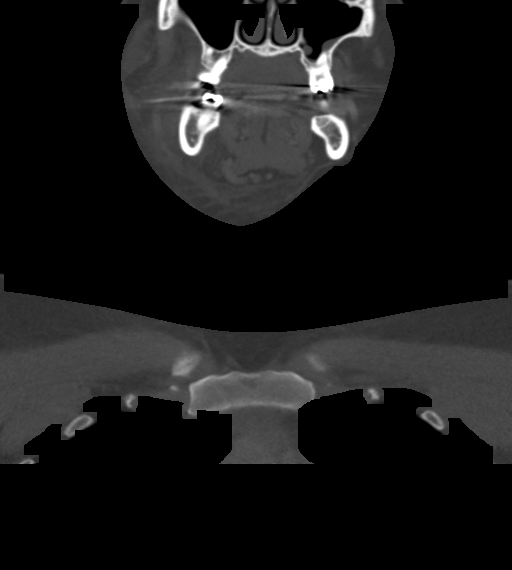
[im 44/101  bone]
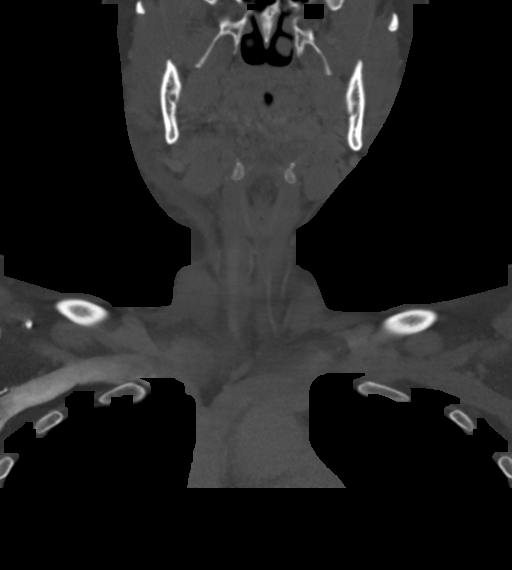
[im 57/101  bone]
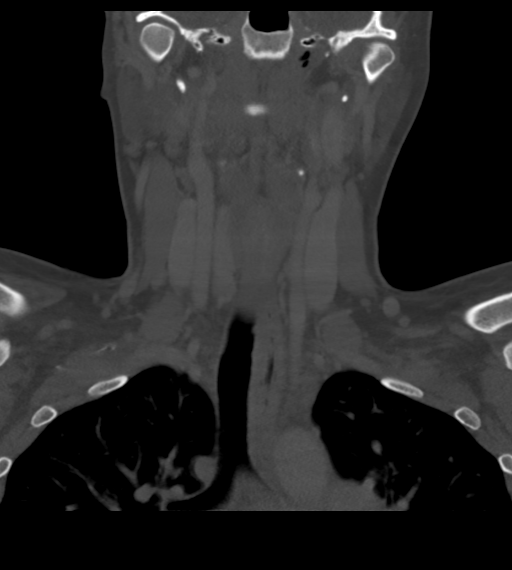

[Series 6: orthogonal ax · axial · 0.39mm/px · z∈[+1032,+1191]mm · 6 of 121 slices shown, 8 images]
[im 18/121  soft-tissue]
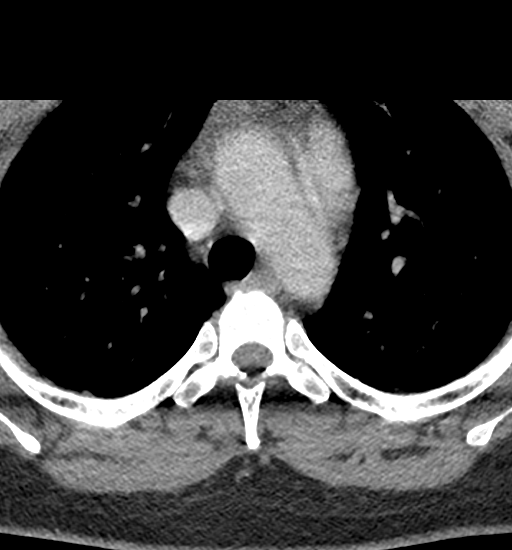
[im 18/121  bone]
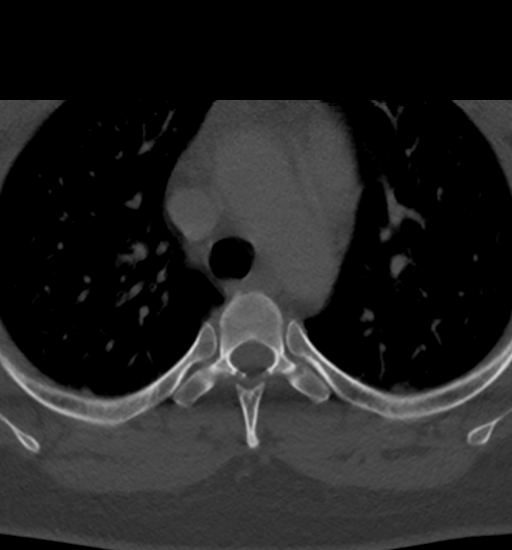
[im 35/121  bone]
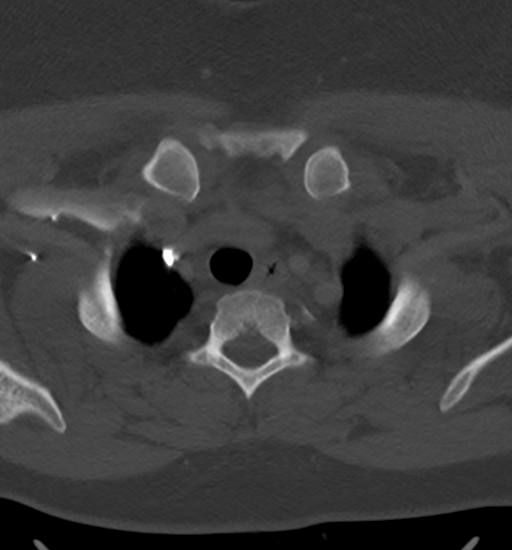
[im 52/121  bone]
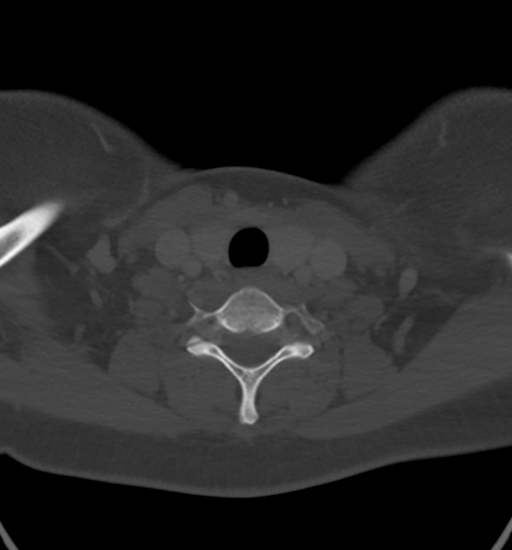
[im 69/121  bone]
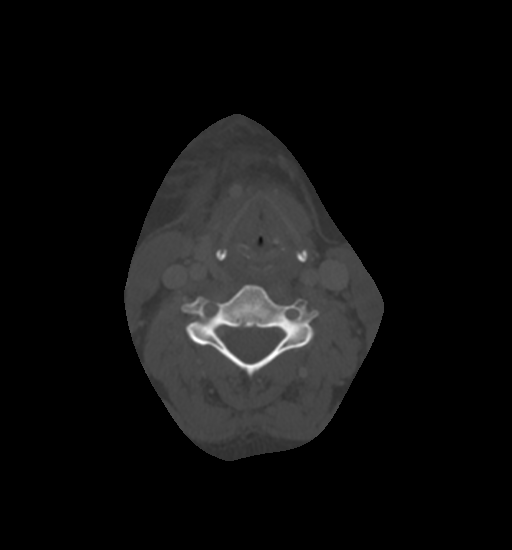
[im 86/121  soft-tissue]
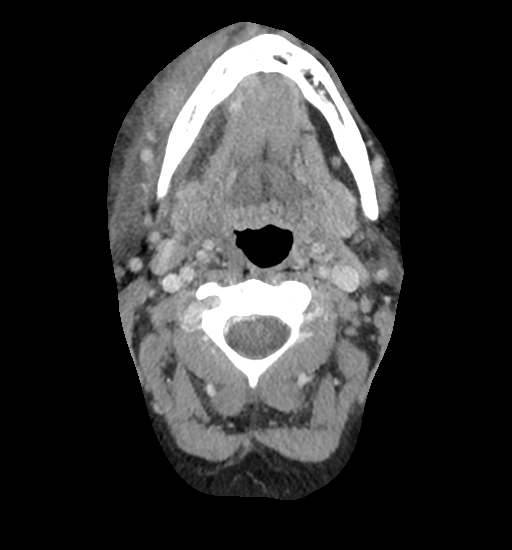
[im 86/121  bone]
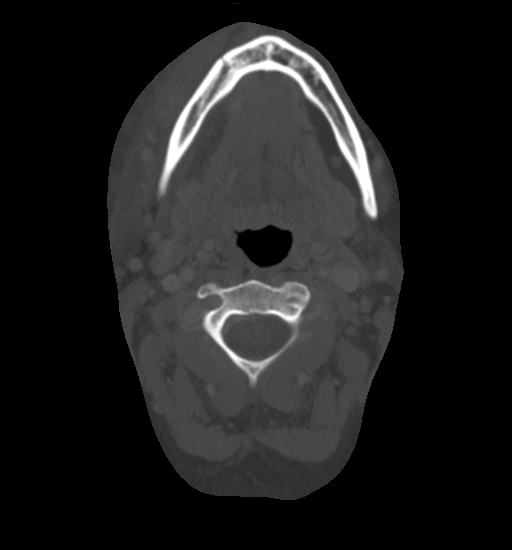
[im 103/121  bone]
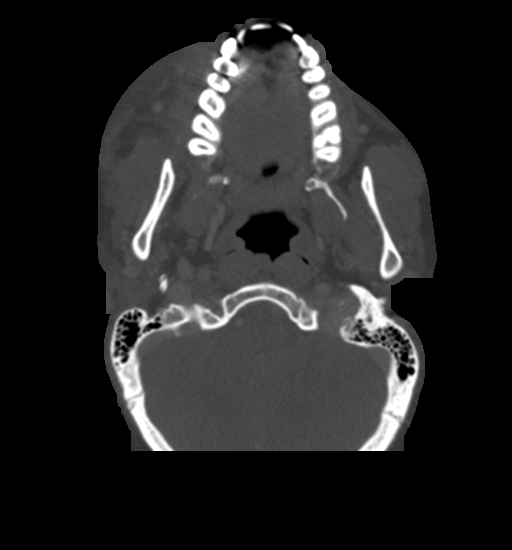

[14 of 33 positions shown; findings below may reference images not displayed]

FINDINGS: Pharynx and larynx: The nasopharynx is clear. The oropharynx and
hypopharynx are normal. The epiglottis, supraglottic larynx, glottis
and subglottic larynx are normal. No retropharyngeal collection. The
parapharyngeal spaces are preserved.

Salivary glands: The parotid and submandibular glands are normal. No
sialolithiasis or salivary ductal dilatation.

Thyroid: Normal

Lymph nodes: Enlarged right level 1 B lymph nodes measure up to 7
mm.

Vascular: The major cervical vessels are normal.

Limited intracranial: Normal

Visualized orbits: Normal

Mastoids and visualized paranasal sinuses: Clear

Skeleton: Normal

Upper chest: Clear

Other: There is severe soft tissue swelling of the right face
overlying the right mandibular body. There is inflammatory stranding
within the subcutaneous fat of the right face extending inferiorly
below the mandible. There is no discrete fluid collection. There is
no clear source identified.
IMPRESSION: 1. Soft tissue swelling and inflammation of the right face overlying
the right mandibular body without a clear dental source. No
drainable fluid collection.
2. Reactive right level 1 lymphadenopathy.
